# Patient Record
Sex: Male | Born: 1950
Health system: Southern US, Community
[De-identification: ages and names within clinical notes are randomized; demographics above are authoritative.]

---

## 2005-11-18 ENCOUNTER — Encounter: Admission: RE | Admit: 2005-11-18 | Discharge: 2005-11-18 | Payer: Self-pay | Admitting: Internal Medicine

## 2007-03-20 ENCOUNTER — Encounter: Admission: RE | Admit: 2007-03-20 | Discharge: 2007-03-20 | Payer: Self-pay | Admitting: Gastroenterology

## 2007-11-25 ENCOUNTER — Encounter: Admission: RE | Admit: 2007-11-25 | Discharge: 2007-11-25 | Payer: Self-pay | Admitting: Endocrinology

## 2013-03-09 DIAGNOSIS — H547 Unspecified visual loss: Secondary | ICD-10-CM | POA: Diagnosis not present

## 2013-03-09 DIAGNOSIS — R040 Epistaxis: Secondary | ICD-10-CM | POA: Diagnosis not present

## 2013-03-09 DIAGNOSIS — J3489 Other specified disorders of nose and nasal sinuses: Secondary | ICD-10-CM | POA: Diagnosis not present

## 2013-03-09 DIAGNOSIS — J329 Chronic sinusitis, unspecified: Secondary | ICD-10-CM | POA: Diagnosis not present

## 2013-03-16 DIAGNOSIS — H538 Other visual disturbances: Secondary | ICD-10-CM | POA: Diagnosis not present

## 2013-03-16 DIAGNOSIS — H35349 Macular cyst, hole, or pseudohole, unspecified eye: Secondary | ICD-10-CM | POA: Diagnosis not present

## 2013-03-18 DIAGNOSIS — H5210 Myopia, unspecified eye: Secondary | ICD-10-CM | POA: Diagnosis not present

## 2013-03-18 DIAGNOSIS — Z9889 Other specified postprocedural states: Secondary | ICD-10-CM | POA: Diagnosis not present

## 2013-03-18 DIAGNOSIS — H43829 Vitreomacular adhesion, unspecified eye: Secondary | ICD-10-CM | POA: Diagnosis not present

## 2013-03-18 DIAGNOSIS — H35349 Macular cyst, hole, or pseudohole, unspecified eye: Secondary | ICD-10-CM | POA: Diagnosis not present

## 2013-04-08 ENCOUNTER — Ambulatory Visit
Admission: RE | Admit: 2013-04-08 | Discharge: 2013-04-08 | Disposition: A | Discharge status: Home / Self Care | Payer: 59 | Source: Ambulatory Visit | Attending: Internal Medicine | Admitting: Internal Medicine

## 2013-04-08 ENCOUNTER — Other Ambulatory Visit: Payer: Self-pay | Admitting: Internal Medicine

## 2013-04-08 DIAGNOSIS — E785 Hyperlipidemia, unspecified: Secondary | ICD-10-CM | POA: Diagnosis not present

## 2013-04-08 DIAGNOSIS — Z79899 Other long term (current) drug therapy: Secondary | ICD-10-CM | POA: Diagnosis not present

## 2013-04-08 DIAGNOSIS — M545 Low back pain, unspecified: Secondary | ICD-10-CM

## 2013-04-08 DIAGNOSIS — N400 Enlarged prostate without lower urinary tract symptoms: Secondary | ICD-10-CM | POA: Diagnosis not present

## 2013-04-08 DIAGNOSIS — M47817 Spondylosis without myelopathy or radiculopathy, lumbosacral region: Secondary | ICD-10-CM | POA: Diagnosis not present

## 2013-04-08 DIAGNOSIS — Z0000 Encounter for general adult medical examination without abnormal findings: Secondary | ICD-10-CM | POA: Diagnosis not present

## 2013-04-08 DIAGNOSIS — E291 Testicular hypofunction: Secondary | ICD-10-CM | POA: Diagnosis not present

## 2013-04-08 DIAGNOSIS — Z23 Encounter for immunization: Secondary | ICD-10-CM | POA: Diagnosis not present

## 2013-04-08 DIAGNOSIS — I10 Essential (primary) hypertension: Secondary | ICD-10-CM | POA: Diagnosis not present

## 2013-04-08 DIAGNOSIS — Z1331 Encounter for screening for depression: Secondary | ICD-10-CM | POA: Diagnosis not present

## 2013-04-21 DIAGNOSIS — H3581 Retinal edema: Secondary | ICD-10-CM | POA: Diagnosis not present

## 2013-04-21 DIAGNOSIS — Z7982 Long term (current) use of aspirin: Secondary | ICD-10-CM | POA: Diagnosis not present

## 2013-04-21 DIAGNOSIS — I10 Essential (primary) hypertension: Secondary | ICD-10-CM | POA: Diagnosis not present

## 2013-04-21 DIAGNOSIS — H35349 Macular cyst, hole, or pseudohole, unspecified eye: Secondary | ICD-10-CM | POA: Diagnosis not present

## 2013-04-21 DIAGNOSIS — K219 Gastro-esophageal reflux disease without esophagitis: Secondary | ICD-10-CM | POA: Diagnosis not present

## 2013-05-24 DIAGNOSIS — H35349 Macular cyst, hole, or pseudohole, unspecified eye: Secondary | ICD-10-CM | POA: Diagnosis not present

## 2013-05-28 DIAGNOSIS — K219 Gastro-esophageal reflux disease without esophagitis: Secondary | ICD-10-CM | POA: Diagnosis not present

## 2013-05-28 DIAGNOSIS — H35349 Macular cyst, hole, or pseudohole, unspecified eye: Secondary | ICD-10-CM | POA: Diagnosis not present

## 2013-05-28 DIAGNOSIS — I10 Essential (primary) hypertension: Secondary | ICD-10-CM | POA: Diagnosis not present

## 2013-05-28 DIAGNOSIS — Z7982 Long term (current) use of aspirin: Secondary | ICD-10-CM | POA: Diagnosis not present

## 2013-06-01 DIAGNOSIS — M545 Low back pain, unspecified: Secondary | ICD-10-CM | POA: Diagnosis not present

## 2013-06-22 DIAGNOSIS — H43829 Vitreomacular adhesion, unspecified eye: Secondary | ICD-10-CM | POA: Diagnosis not present

## 2013-06-22 DIAGNOSIS — Z48810 Encounter for surgical aftercare following surgery on the sense organs: Secondary | ICD-10-CM | POA: Diagnosis not present

## 2013-06-22 DIAGNOSIS — Z9889 Other specified postprocedural states: Secondary | ICD-10-CM | POA: Diagnosis not present

## 2013-07-08 DIAGNOSIS — R5381 Other malaise: Secondary | ICD-10-CM | POA: Diagnosis not present

## 2013-07-08 DIAGNOSIS — E559 Vitamin D deficiency, unspecified: Secondary | ICD-10-CM | POA: Diagnosis not present

## 2013-07-08 DIAGNOSIS — M810 Age-related osteoporosis without current pathological fracture: Secondary | ICD-10-CM | POA: Diagnosis not present

## 2013-07-08 DIAGNOSIS — E291 Testicular hypofunction: Secondary | ICD-10-CM | POA: Diagnosis not present

## 2013-07-08 DIAGNOSIS — IMO0001 Reserved for inherently not codable concepts without codable children: Secondary | ICD-10-CM | POA: Diagnosis not present

## 2013-07-27 DIAGNOSIS — R040 Epistaxis: Secondary | ICD-10-CM | POA: Diagnosis not present

## 2013-07-27 DIAGNOSIS — J342 Deviated nasal septum: Secondary | ICD-10-CM | POA: Diagnosis not present

## 2013-07-27 DIAGNOSIS — J328 Other chronic sinusitis: Secondary | ICD-10-CM | POA: Diagnosis not present

## 2013-08-10 DIAGNOSIS — IMO0001 Reserved for inherently not codable concepts without codable children: Secondary | ICD-10-CM | POA: Diagnosis not present

## 2013-08-19 DIAGNOSIS — H43829 Vitreomacular adhesion, unspecified eye: Secondary | ICD-10-CM | POA: Diagnosis not present

## 2013-08-19 DIAGNOSIS — Z9889 Other specified postprocedural states: Secondary | ICD-10-CM | POA: Diagnosis not present

## 2013-08-19 DIAGNOSIS — Z48810 Encounter for surgical aftercare following surgery on the sense organs: Secondary | ICD-10-CM | POA: Diagnosis not present

## 2013-09-23 DIAGNOSIS — R5383 Other fatigue: Secondary | ICD-10-CM | POA: Diagnosis not present

## 2013-09-23 DIAGNOSIS — IMO0001 Reserved for inherently not codable concepts without codable children: Secondary | ICD-10-CM | POA: Diagnosis not present

## 2013-09-23 DIAGNOSIS — R5381 Other malaise: Secondary | ICD-10-CM | POA: Diagnosis not present

## 2013-09-23 DIAGNOSIS — R748 Abnormal levels of other serum enzymes: Secondary | ICD-10-CM | POA: Diagnosis not present

## 2013-10-12 DIAGNOSIS — D485 Neoplasm of uncertain behavior of skin: Secondary | ICD-10-CM | POA: Diagnosis not present

## 2013-10-12 DIAGNOSIS — L820 Inflamed seborrheic keratosis: Secondary | ICD-10-CM | POA: Diagnosis not present

## 2013-10-12 DIAGNOSIS — L578 Other skin changes due to chronic exposure to nonionizing radiation: Secondary | ICD-10-CM | POA: Diagnosis not present

## 2013-10-12 DIAGNOSIS — Z872 Personal history of diseases of the skin and subcutaneous tissue: Secondary | ICD-10-CM | POA: Diagnosis not present

## 2013-10-12 DIAGNOSIS — D239 Other benign neoplasm of skin, unspecified: Secondary | ICD-10-CM | POA: Diagnosis not present

## 2013-10-21 DIAGNOSIS — K219 Gastro-esophageal reflux disease without esophagitis: Secondary | ICD-10-CM | POA: Diagnosis not present

## 2013-10-21 DIAGNOSIS — Z23 Encounter for immunization: Secondary | ICD-10-CM | POA: Diagnosis not present

## 2013-10-21 DIAGNOSIS — I10 Essential (primary) hypertension: Secondary | ICD-10-CM | POA: Diagnosis not present

## 2013-10-21 DIAGNOSIS — IMO0001 Reserved for inherently not codable concepts without codable children: Secondary | ICD-10-CM | POA: Diagnosis not present

## 2013-10-21 DIAGNOSIS — C4330 Malignant melanoma of unspecified part of face: Secondary | ICD-10-CM | POA: Diagnosis not present

## 2013-11-18 DIAGNOSIS — D039 Melanoma in situ, unspecified: Secondary | ICD-10-CM | POA: Diagnosis not present

## 2013-11-18 DIAGNOSIS — D0339 Melanoma in situ of other parts of face: Secondary | ICD-10-CM | POA: Diagnosis not present

## 2013-11-18 DIAGNOSIS — C44519 Basal cell carcinoma of skin of other part of trunk: Secondary | ICD-10-CM | POA: Diagnosis not present

## 2013-12-14 DIAGNOSIS — Z9889 Other specified postprocedural states: Secondary | ICD-10-CM | POA: Diagnosis not present

## 2013-12-14 DIAGNOSIS — H47233 Glaucomatous optic atrophy, bilateral: Secondary | ICD-10-CM | POA: Diagnosis not present

## 2013-12-14 DIAGNOSIS — H25811 Combined forms of age-related cataract, right eye: Secondary | ICD-10-CM | POA: Diagnosis not present

## 2013-12-14 DIAGNOSIS — H2512 Age-related nuclear cataract, left eye: Secondary | ICD-10-CM | POA: Diagnosis not present

## 2013-12-20 DIAGNOSIS — Z9889 Other specified postprocedural states: Secondary | ICD-10-CM | POA: Diagnosis not present

## 2013-12-20 DIAGNOSIS — N400 Enlarged prostate without lower urinary tract symptoms: Secondary | ICD-10-CM | POA: Diagnosis not present

## 2013-12-20 DIAGNOSIS — K219 Gastro-esophageal reflux disease without esophagitis: Secondary | ICD-10-CM | POA: Diagnosis not present

## 2013-12-20 DIAGNOSIS — H25811 Combined forms of age-related cataract, right eye: Secondary | ICD-10-CM | POA: Diagnosis not present

## 2013-12-20 DIAGNOSIS — I10 Essential (primary) hypertension: Secondary | ICD-10-CM | POA: Diagnosis not present

## 2014-01-05 DIAGNOSIS — I10 Essential (primary) hypertension: Secondary | ICD-10-CM | POA: Diagnosis not present

## 2014-01-05 DIAGNOSIS — H269 Unspecified cataract: Secondary | ICD-10-CM | POA: Diagnosis not present

## 2014-01-05 DIAGNOSIS — H25811 Combined forms of age-related cataract, right eye: Secondary | ICD-10-CM | POA: Diagnosis not present

## 2014-01-05 DIAGNOSIS — K219 Gastro-esophageal reflux disease without esophagitis: Secondary | ICD-10-CM | POA: Diagnosis not present

## 2014-01-05 DIAGNOSIS — N400 Enlarged prostate without lower urinary tract symptoms: Secondary | ICD-10-CM | POA: Diagnosis not present

## 2014-01-25 DIAGNOSIS — J3489 Other specified disorders of nose and nasal sinuses: Secondary | ICD-10-CM | POA: Diagnosis not present

## 2014-01-25 DIAGNOSIS — J328 Other chronic sinusitis: Secondary | ICD-10-CM | POA: Diagnosis not present

## 2014-03-24 DIAGNOSIS — M810 Age-related osteoporosis without current pathological fracture: Secondary | ICD-10-CM | POA: Diagnosis not present

## 2014-03-24 DIAGNOSIS — M791 Myalgia: Secondary | ICD-10-CM | POA: Diagnosis not present

## 2014-03-24 DIAGNOSIS — M549 Dorsalgia, unspecified: Secondary | ICD-10-CM | POA: Diagnosis not present

## 2014-03-24 DIAGNOSIS — M6080 Other myositis, unspecified site: Secondary | ICD-10-CM | POA: Diagnosis not present

## 2014-03-24 DIAGNOSIS — M609 Myositis, unspecified: Secondary | ICD-10-CM | POA: Diagnosis not present

## 2014-04-05 DIAGNOSIS — R0789 Other chest pain: Secondary | ICD-10-CM | POA: Diagnosis not present

## 2014-04-05 DIAGNOSIS — R0600 Dyspnea, unspecified: Secondary | ICD-10-CM | POA: Diagnosis not present

## 2014-04-12 DIAGNOSIS — R079 Chest pain, unspecified: Secondary | ICD-10-CM | POA: Diagnosis not present

## 2014-04-21 DIAGNOSIS — N400 Enlarged prostate without lower urinary tract symptoms: Secondary | ICD-10-CM | POA: Diagnosis not present

## 2014-04-21 DIAGNOSIS — R5383 Other fatigue: Secondary | ICD-10-CM | POA: Diagnosis not present

## 2014-04-21 DIAGNOSIS — E782 Mixed hyperlipidemia: Secondary | ICD-10-CM | POA: Diagnosis not present

## 2014-04-21 DIAGNOSIS — Z1389 Encounter for screening for other disorder: Secondary | ICD-10-CM | POA: Diagnosis not present

## 2014-04-21 DIAGNOSIS — Z23 Encounter for immunization: Secondary | ICD-10-CM | POA: Diagnosis not present

## 2014-04-21 DIAGNOSIS — I10 Essential (primary) hypertension: Secondary | ICD-10-CM | POA: Diagnosis not present

## 2014-04-21 DIAGNOSIS — M545 Low back pain: Secondary | ICD-10-CM | POA: Diagnosis not present

## 2014-04-21 DIAGNOSIS — K219 Gastro-esophageal reflux disease without esophagitis: Secondary | ICD-10-CM | POA: Diagnosis not present

## 2014-04-22 ENCOUNTER — Other Ambulatory Visit: Payer: Self-pay | Admitting: Internal Medicine

## 2014-04-22 DIAGNOSIS — M79604 Pain in right leg: Secondary | ICD-10-CM

## 2014-04-22 DIAGNOSIS — M545 Low back pain: Secondary | ICD-10-CM

## 2014-05-04 ENCOUNTER — Ambulatory Visit
Admission: RE | Admit: 2014-05-04 | Discharge: 2014-05-04 | Disposition: A | Discharge status: Home / Self Care | Payer: Medicare Other | Source: Ambulatory Visit | Attending: Internal Medicine | Admitting: Internal Medicine

## 2014-05-04 DIAGNOSIS — M545 Low back pain: Secondary | ICD-10-CM

## 2014-05-04 DIAGNOSIS — M79604 Pain in right leg: Secondary | ICD-10-CM

## 2014-05-04 DIAGNOSIS — M4806 Spinal stenosis, lumbar region: Secondary | ICD-10-CM | POA: Diagnosis not present

## 2014-05-04 DIAGNOSIS — M5127 Other intervertebral disc displacement, lumbosacral region: Secondary | ICD-10-CM | POA: Diagnosis not present

## 2014-05-04 DIAGNOSIS — M47817 Spondylosis without myelopathy or radiculopathy, lumbosacral region: Secondary | ICD-10-CM | POA: Diagnosis not present

## 2014-05-17 DIAGNOSIS — H25811 Combined forms of age-related cataract, right eye: Secondary | ICD-10-CM | POA: Diagnosis not present

## 2014-05-17 DIAGNOSIS — H43822 Vitreomacular adhesion, left eye: Secondary | ICD-10-CM | POA: Diagnosis not present

## 2014-05-17 DIAGNOSIS — Z9841 Cataract extraction status, right eye: Secondary | ICD-10-CM | POA: Diagnosis not present

## 2014-05-17 DIAGNOSIS — Z9889 Other specified postprocedural states: Secondary | ICD-10-CM | POA: Diagnosis not present

## 2014-05-17 DIAGNOSIS — Z961 Presence of intraocular lens: Secondary | ICD-10-CM | POA: Diagnosis not present

## 2014-05-17 DIAGNOSIS — H35341 Macular cyst, hole, or pseudohole, right eye: Secondary | ICD-10-CM | POA: Diagnosis not present

## 2014-05-17 DIAGNOSIS — H40013 Open angle with borderline findings, low risk, bilateral: Secondary | ICD-10-CM | POA: Diagnosis not present

## 2014-07-11 DIAGNOSIS — M545 Low back pain: Secondary | ICD-10-CM | POA: Diagnosis not present

## 2014-07-11 DIAGNOSIS — M5416 Radiculopathy, lumbar region: Secondary | ICD-10-CM | POA: Diagnosis not present

## 2014-07-11 DIAGNOSIS — M4316 Spondylolisthesis, lumbar region: Secondary | ICD-10-CM | POA: Diagnosis not present

## 2014-07-11 DIAGNOSIS — M4806 Spinal stenosis, lumbar region: Secondary | ICD-10-CM | POA: Diagnosis not present

## 2014-08-30 DIAGNOSIS — H40013 Open angle with borderline findings, low risk, bilateral: Secondary | ICD-10-CM | POA: Diagnosis not present

## 2014-08-30 DIAGNOSIS — H40012 Open angle with borderline findings, low risk, left eye: Secondary | ICD-10-CM | POA: Diagnosis not present

## 2014-08-30 DIAGNOSIS — H2512 Age-related nuclear cataract, left eye: Secondary | ICD-10-CM | POA: Diagnosis not present

## 2014-08-30 DIAGNOSIS — H25811 Combined forms of age-related cataract, right eye: Secondary | ICD-10-CM | POA: Diagnosis not present

## 2014-08-30 DIAGNOSIS — H4011X1 Primary open-angle glaucoma, mild stage: Secondary | ICD-10-CM | POA: Diagnosis not present

## 2014-08-30 DIAGNOSIS — H35341 Macular cyst, hole, or pseudohole, right eye: Secondary | ICD-10-CM | POA: Diagnosis not present

## 2014-09-12 DIAGNOSIS — M5416 Radiculopathy, lumbar region: Secondary | ICD-10-CM | POA: Diagnosis not present

## 2014-09-12 DIAGNOSIS — M545 Low back pain: Secondary | ICD-10-CM | POA: Diagnosis not present

## 2014-09-12 DIAGNOSIS — M4316 Spondylolisthesis, lumbar region: Secondary | ICD-10-CM | POA: Diagnosis not present

## 2014-09-12 DIAGNOSIS — M4806 Spinal stenosis, lumbar region: Secondary | ICD-10-CM | POA: Diagnosis not present

## 2014-10-17 DIAGNOSIS — M545 Low back pain: Secondary | ICD-10-CM | POA: Diagnosis not present

## 2014-10-17 DIAGNOSIS — M47816 Spondylosis without myelopathy or radiculopathy, lumbar region: Secondary | ICD-10-CM | POA: Diagnosis not present

## 2014-10-17 DIAGNOSIS — M4806 Spinal stenosis, lumbar region: Secondary | ICD-10-CM | POA: Diagnosis not present

## 2014-10-18 DIAGNOSIS — H40012 Open angle with borderline findings, low risk, left eye: Secondary | ICD-10-CM | POA: Diagnosis not present

## 2014-10-18 DIAGNOSIS — H4011X1 Primary open-angle glaucoma, mild stage: Secondary | ICD-10-CM | POA: Diagnosis not present

## 2014-10-21 DIAGNOSIS — L578 Other skin changes due to chronic exposure to nonionizing radiation: Secondary | ICD-10-CM | POA: Diagnosis not present

## 2014-10-21 DIAGNOSIS — Z87898 Personal history of other specified conditions: Secondary | ICD-10-CM | POA: Diagnosis not present

## 2014-10-21 DIAGNOSIS — H2512 Age-related nuclear cataract, left eye: Secondary | ICD-10-CM | POA: Diagnosis not present

## 2014-10-21 DIAGNOSIS — Z961 Presence of intraocular lens: Secondary | ICD-10-CM | POA: Diagnosis not present

## 2014-10-21 DIAGNOSIS — D1801 Hemangioma of skin and subcutaneous tissue: Secondary | ICD-10-CM | POA: Diagnosis not present

## 2014-10-21 DIAGNOSIS — H43822 Vitreomacular adhesion, left eye: Secondary | ICD-10-CM | POA: Diagnosis not present

## 2014-10-21 DIAGNOSIS — L570 Actinic keratosis: Secondary | ICD-10-CM | POA: Diagnosis not present

## 2014-10-21 DIAGNOSIS — L821 Other seborrheic keratosis: Secondary | ICD-10-CM | POA: Diagnosis not present

## 2014-10-21 DIAGNOSIS — Z85828 Personal history of other malignant neoplasm of skin: Secondary | ICD-10-CM | POA: Diagnosis not present

## 2014-10-21 DIAGNOSIS — H35341 Macular cyst, hole, or pseudohole, right eye: Secondary | ICD-10-CM | POA: Diagnosis not present

## 2014-10-24 DIAGNOSIS — M4806 Spinal stenosis, lumbar region: Secondary | ICD-10-CM | POA: Diagnosis not present

## 2014-10-24 DIAGNOSIS — I10 Essential (primary) hypertension: Secondary | ICD-10-CM | POA: Diagnosis not present

## 2014-10-24 DIAGNOSIS — Z23 Encounter for immunization: Secondary | ICD-10-CM | POA: Diagnosis not present

## 2014-10-24 DIAGNOSIS — K219 Gastro-esophageal reflux disease without esophagitis: Secondary | ICD-10-CM | POA: Diagnosis not present

## 2014-11-14 DIAGNOSIS — M545 Low back pain: Secondary | ICD-10-CM | POA: Diagnosis not present

## 2014-11-21 DIAGNOSIS — H2512 Age-related nuclear cataract, left eye: Secondary | ICD-10-CM | POA: Diagnosis not present

## 2014-11-21 DIAGNOSIS — H35341 Macular cyst, hole, or pseudohole, right eye: Secondary | ICD-10-CM | POA: Diagnosis not present

## 2014-11-21 DIAGNOSIS — Z961 Presence of intraocular lens: Secondary | ICD-10-CM | POA: Diagnosis not present

## 2014-11-21 DIAGNOSIS — E785 Hyperlipidemia, unspecified: Secondary | ICD-10-CM | POA: Diagnosis not present

## 2014-11-21 DIAGNOSIS — Z7982 Long term (current) use of aspirin: Secondary | ICD-10-CM | POA: Diagnosis not present

## 2014-11-21 DIAGNOSIS — H269 Unspecified cataract: Secondary | ICD-10-CM | POA: Diagnosis not present

## 2014-11-21 DIAGNOSIS — I10 Essential (primary) hypertension: Secondary | ICD-10-CM | POA: Diagnosis not present

## 2014-11-21 DIAGNOSIS — H43822 Vitreomacular adhesion, left eye: Secondary | ICD-10-CM | POA: Diagnosis not present

## 2014-11-21 DIAGNOSIS — H5213 Myopia, bilateral: Secondary | ICD-10-CM | POA: Diagnosis not present

## 2014-11-21 DIAGNOSIS — K219 Gastro-esophageal reflux disease without esophagitis: Secondary | ICD-10-CM | POA: Diagnosis not present

## 2014-11-21 DIAGNOSIS — Z79899 Other long term (current) drug therapy: Secondary | ICD-10-CM | POA: Diagnosis not present

## 2014-12-27 DIAGNOSIS — M5416 Radiculopathy, lumbar region: Secondary | ICD-10-CM | POA: Diagnosis not present

## 2014-12-27 DIAGNOSIS — M4316 Spondylolisthesis, lumbar region: Secondary | ICD-10-CM | POA: Diagnosis not present

## 2014-12-27 DIAGNOSIS — M4806 Spinal stenosis, lumbar region: Secondary | ICD-10-CM | POA: Diagnosis not present

## 2014-12-27 DIAGNOSIS — Z6829 Body mass index (BMI) 29.0-29.9, adult: Secondary | ICD-10-CM | POA: Diagnosis not present

## 2014-12-27 DIAGNOSIS — M47816 Spondylosis without myelopathy or radiculopathy, lumbar region: Secondary | ICD-10-CM | POA: Diagnosis not present

## 2014-12-27 DIAGNOSIS — M545 Low back pain: Secondary | ICD-10-CM | POA: Diagnosis not present

## 2015-01-12 DIAGNOSIS — M47816 Spondylosis without myelopathy or radiculopathy, lumbar region: Secondary | ICD-10-CM | POA: Diagnosis not present

## 2015-01-12 DIAGNOSIS — M4316 Spondylolisthesis, lumbar region: Secondary | ICD-10-CM | POA: Diagnosis not present

## 2015-01-23 DIAGNOSIS — H35372 Puckering of macula, left eye: Secondary | ICD-10-CM | POA: Diagnosis not present

## 2015-01-23 DIAGNOSIS — H43822 Vitreomacular adhesion, left eye: Secondary | ICD-10-CM | POA: Diagnosis not present

## 2015-01-23 DIAGNOSIS — I10 Essential (primary) hypertension: Secondary | ICD-10-CM | POA: Diagnosis not present

## 2015-01-23 DIAGNOSIS — Z7982 Long term (current) use of aspirin: Secondary | ICD-10-CM | POA: Diagnosis not present

## 2015-01-23 DIAGNOSIS — H35341 Macular cyst, hole, or pseudohole, right eye: Secondary | ICD-10-CM | POA: Diagnosis not present

## 2015-01-23 DIAGNOSIS — Z9841 Cataract extraction status, right eye: Secondary | ICD-10-CM | POA: Diagnosis not present

## 2015-01-23 DIAGNOSIS — Z79899 Other long term (current) drug therapy: Secondary | ICD-10-CM | POA: Diagnosis not present

## 2015-01-23 DIAGNOSIS — H5213 Myopia, bilateral: Secondary | ICD-10-CM | POA: Diagnosis not present

## 2015-01-23 DIAGNOSIS — Z9889 Other specified postprocedural states: Secondary | ICD-10-CM | POA: Diagnosis not present

## 2015-01-23 DIAGNOSIS — Z961 Presence of intraocular lens: Secondary | ICD-10-CM | POA: Diagnosis not present

## 2015-01-23 DIAGNOSIS — E785 Hyperlipidemia, unspecified: Secondary | ICD-10-CM | POA: Diagnosis not present

## 2015-01-26 DIAGNOSIS — M47816 Spondylosis without myelopathy or radiculopathy, lumbar region: Secondary | ICD-10-CM | POA: Diagnosis not present

## 2015-01-26 DIAGNOSIS — M5416 Radiculopathy, lumbar region: Secondary | ICD-10-CM | POA: Diagnosis not present

## 2015-01-26 DIAGNOSIS — M4806 Spinal stenosis, lumbar region: Secondary | ICD-10-CM | POA: Diagnosis not present

## 2015-01-26 DIAGNOSIS — M4316 Spondylolisthesis, lumbar region: Secondary | ICD-10-CM | POA: Diagnosis not present

## 2015-02-08 DIAGNOSIS — H35372 Puckering of macula, left eye: Secondary | ICD-10-CM | POA: Diagnosis not present

## 2015-02-08 DIAGNOSIS — Z91030 Bee allergy status: Secondary | ICD-10-CM | POA: Diagnosis not present

## 2015-02-08 DIAGNOSIS — H43822 Vitreomacular adhesion, left eye: Secondary | ICD-10-CM | POA: Diagnosis not present

## 2015-02-08 DIAGNOSIS — N400 Enlarged prostate without lower urinary tract symptoms: Secondary | ICD-10-CM | POA: Diagnosis not present

## 2015-02-08 DIAGNOSIS — Z7951 Long term (current) use of inhaled steroids: Secondary | ICD-10-CM | POA: Diagnosis not present

## 2015-02-08 DIAGNOSIS — H35342 Macular cyst, hole, or pseudohole, left eye: Secondary | ICD-10-CM | POA: Diagnosis not present

## 2015-02-08 DIAGNOSIS — Z961 Presence of intraocular lens: Secondary | ICD-10-CM | POA: Diagnosis not present

## 2015-02-08 DIAGNOSIS — M810 Age-related osteoporosis without current pathological fracture: Secondary | ICD-10-CM | POA: Diagnosis not present

## 2015-02-08 DIAGNOSIS — Z85828 Personal history of other malignant neoplasm of skin: Secondary | ICD-10-CM | POA: Diagnosis not present

## 2015-02-08 DIAGNOSIS — Z79899 Other long term (current) drug therapy: Secondary | ICD-10-CM | POA: Diagnosis not present

## 2015-02-08 DIAGNOSIS — I10 Essential (primary) hypertension: Secondary | ICD-10-CM | POA: Diagnosis not present

## 2015-02-08 DIAGNOSIS — E785 Hyperlipidemia, unspecified: Secondary | ICD-10-CM | POA: Diagnosis not present

## 2015-02-08 DIAGNOSIS — K219 Gastro-esophageal reflux disease without esophagitis: Secondary | ICD-10-CM | POA: Diagnosis not present

## 2015-02-08 DIAGNOSIS — Z85820 Personal history of malignant melanoma of skin: Secondary | ICD-10-CM | POA: Diagnosis not present

## 2015-02-08 DIAGNOSIS — Z7982 Long term (current) use of aspirin: Secondary | ICD-10-CM | POA: Diagnosis not present

## 2015-02-09 DIAGNOSIS — H43822 Vitreomacular adhesion, left eye: Secondary | ICD-10-CM | POA: Diagnosis not present

## 2015-02-13 DIAGNOSIS — Z7982 Long term (current) use of aspirin: Secondary | ICD-10-CM | POA: Diagnosis not present

## 2015-02-13 DIAGNOSIS — H5213 Myopia, bilateral: Secondary | ICD-10-CM | POA: Diagnosis not present

## 2015-02-13 DIAGNOSIS — H35372 Puckering of macula, left eye: Secondary | ICD-10-CM | POA: Diagnosis not present

## 2015-02-13 DIAGNOSIS — Z9841 Cataract extraction status, right eye: Secondary | ICD-10-CM | POA: Diagnosis not present

## 2015-02-13 DIAGNOSIS — Z961 Presence of intraocular lens: Secondary | ICD-10-CM | POA: Diagnosis not present

## 2015-02-13 DIAGNOSIS — Z79899 Other long term (current) drug therapy: Secondary | ICD-10-CM | POA: Diagnosis not present

## 2015-02-13 DIAGNOSIS — H2512 Age-related nuclear cataract, left eye: Secondary | ICD-10-CM | POA: Diagnosis not present

## 2015-02-13 DIAGNOSIS — H43822 Vitreomacular adhesion, left eye: Secondary | ICD-10-CM | POA: Diagnosis not present

## 2015-02-28 DIAGNOSIS — Z9889 Other specified postprocedural states: Secondary | ICD-10-CM | POA: Diagnosis not present

## 2015-02-28 DIAGNOSIS — H35341 Macular cyst, hole, or pseudohole, right eye: Secondary | ICD-10-CM | POA: Diagnosis not present

## 2015-02-28 DIAGNOSIS — H401111 Primary open-angle glaucoma, right eye, mild stage: Secondary | ICD-10-CM | POA: Diagnosis not present

## 2015-02-28 DIAGNOSIS — H25811 Combined forms of age-related cataract, right eye: Secondary | ICD-10-CM | POA: Diagnosis not present

## 2015-02-28 DIAGNOSIS — H40012 Open angle with borderline findings, low risk, left eye: Secondary | ICD-10-CM | POA: Diagnosis not present

## 2015-03-06 DIAGNOSIS — Z9841 Cataract extraction status, right eye: Secondary | ICD-10-CM | POA: Diagnosis not present

## 2015-03-06 DIAGNOSIS — Z7982 Long term (current) use of aspirin: Secondary | ICD-10-CM | POA: Diagnosis not present

## 2015-03-06 DIAGNOSIS — Z79899 Other long term (current) drug therapy: Secondary | ICD-10-CM | POA: Diagnosis not present

## 2015-03-06 DIAGNOSIS — H35342 Macular cyst, hole, or pseudohole, left eye: Secondary | ICD-10-CM | POA: Diagnosis not present

## 2015-03-06 DIAGNOSIS — H2512 Age-related nuclear cataract, left eye: Secondary | ICD-10-CM | POA: Diagnosis not present

## 2015-03-06 DIAGNOSIS — I10 Essential (primary) hypertension: Secondary | ICD-10-CM | POA: Diagnosis not present

## 2015-03-06 DIAGNOSIS — E785 Hyperlipidemia, unspecified: Secondary | ICD-10-CM | POA: Diagnosis not present

## 2015-03-06 DIAGNOSIS — H43822 Vitreomacular adhesion, left eye: Secondary | ICD-10-CM | POA: Diagnosis not present

## 2015-03-06 DIAGNOSIS — Z961 Presence of intraocular lens: Secondary | ICD-10-CM | POA: Diagnosis not present

## 2015-03-23 DIAGNOSIS — J3489 Other specified disorders of nose and nasal sinuses: Secondary | ICD-10-CM | POA: Diagnosis not present

## 2015-03-23 DIAGNOSIS — J328 Other chronic sinusitis: Secondary | ICD-10-CM | POA: Diagnosis not present

## 2015-03-30 DIAGNOSIS — M47816 Spondylosis without myelopathy or radiculopathy, lumbar region: Secondary | ICD-10-CM | POA: Diagnosis not present

## 2015-04-20 DIAGNOSIS — H269 Unspecified cataract: Secondary | ICD-10-CM | POA: Diagnosis not present

## 2015-04-20 DIAGNOSIS — H25811 Combined forms of age-related cataract, right eye: Secondary | ICD-10-CM | POA: Diagnosis not present

## 2015-04-20 DIAGNOSIS — H2512 Age-related nuclear cataract, left eye: Secondary | ICD-10-CM | POA: Diagnosis not present

## 2015-04-20 DIAGNOSIS — Z961 Presence of intraocular lens: Secondary | ICD-10-CM | POA: Diagnosis not present

## 2015-04-20 DIAGNOSIS — H40012 Open angle with borderline findings, low risk, left eye: Secondary | ICD-10-CM | POA: Diagnosis not present

## 2015-04-20 DIAGNOSIS — I10 Essential (primary) hypertension: Secondary | ICD-10-CM | POA: Diagnosis not present

## 2015-04-20 DIAGNOSIS — H35342 Macular cyst, hole, or pseudohole, left eye: Secondary | ICD-10-CM | POA: Diagnosis not present

## 2015-04-20 DIAGNOSIS — E785 Hyperlipidemia, unspecified: Secondary | ICD-10-CM | POA: Diagnosis not present

## 2015-04-20 DIAGNOSIS — H401111 Primary open-angle glaucoma, right eye, mild stage: Secondary | ICD-10-CM | POA: Diagnosis not present

## 2015-04-20 DIAGNOSIS — Z7982 Long term (current) use of aspirin: Secondary | ICD-10-CM | POA: Diagnosis not present

## 2015-04-20 DIAGNOSIS — H5213 Myopia, bilateral: Secondary | ICD-10-CM | POA: Diagnosis not present

## 2015-04-20 DIAGNOSIS — Z79899 Other long term (current) drug therapy: Secondary | ICD-10-CM | POA: Diagnosis not present

## 2015-04-20 DIAGNOSIS — H35341 Macular cyst, hole, or pseudohole, right eye: Secondary | ICD-10-CM | POA: Diagnosis not present

## 2015-04-20 DIAGNOSIS — Z9841 Cataract extraction status, right eye: Secondary | ICD-10-CM | POA: Diagnosis not present

## 2015-04-20 DIAGNOSIS — Z9889 Other specified postprocedural states: Secondary | ICD-10-CM | POA: Diagnosis not present

## 2015-05-02 DIAGNOSIS — Z9841 Cataract extraction status, right eye: Secondary | ICD-10-CM | POA: Diagnosis not present

## 2015-05-02 DIAGNOSIS — Z7982 Long term (current) use of aspirin: Secondary | ICD-10-CM | POA: Diagnosis not present

## 2015-05-02 DIAGNOSIS — Z961 Presence of intraocular lens: Secondary | ICD-10-CM | POA: Diagnosis not present

## 2015-05-02 DIAGNOSIS — Z79899 Other long term (current) drug therapy: Secondary | ICD-10-CM | POA: Diagnosis not present

## 2015-05-02 DIAGNOSIS — Z9889 Other specified postprocedural states: Secondary | ICD-10-CM | POA: Diagnosis not present

## 2015-05-02 DIAGNOSIS — I10 Essential (primary) hypertension: Secondary | ICD-10-CM | POA: Diagnosis not present

## 2015-05-02 DIAGNOSIS — E785 Hyperlipidemia, unspecified: Secondary | ICD-10-CM | POA: Diagnosis not present

## 2015-05-02 DIAGNOSIS — H2512 Age-related nuclear cataract, left eye: Secondary | ICD-10-CM | POA: Diagnosis not present

## 2015-05-02 DIAGNOSIS — H401110 Primary open-angle glaucoma, right eye, stage unspecified: Secondary | ICD-10-CM | POA: Diagnosis not present

## 2015-05-04 DIAGNOSIS — I10 Essential (primary) hypertension: Secondary | ICD-10-CM | POA: Diagnosis not present

## 2015-05-04 DIAGNOSIS — M62838 Other muscle spasm: Secondary | ICD-10-CM | POA: Diagnosis not present

## 2015-05-04 DIAGNOSIS — M810 Age-related osteoporosis without current pathological fracture: Secondary | ICD-10-CM | POA: Diagnosis not present

## 2015-05-04 DIAGNOSIS — Z6829 Body mass index (BMI) 29.0-29.9, adult: Secondary | ICD-10-CM | POA: Diagnosis not present

## 2015-05-04 DIAGNOSIS — M47816 Spondylosis without myelopathy or radiculopathy, lumbar region: Secondary | ICD-10-CM | POA: Diagnosis not present

## 2015-05-04 DIAGNOSIS — R030 Elevated blood-pressure reading, without diagnosis of hypertension: Secondary | ICD-10-CM | POA: Diagnosis not present

## 2015-05-04 DIAGNOSIS — R252 Cramp and spasm: Secondary | ICD-10-CM | POA: Diagnosis not present

## 2015-05-04 DIAGNOSIS — M4806 Spinal stenosis, lumbar region: Secondary | ICD-10-CM | POA: Diagnosis not present

## 2015-05-04 DIAGNOSIS — M4316 Spondylolisthesis, lumbar region: Secondary | ICD-10-CM | POA: Diagnosis not present

## 2015-05-04 DIAGNOSIS — Z0000 Encounter for general adult medical examination without abnormal findings: Secondary | ICD-10-CM | POA: Diagnosis not present

## 2015-05-04 DIAGNOSIS — M545 Low back pain: Secondary | ICD-10-CM | POA: Diagnosis not present

## 2015-05-04 DIAGNOSIS — M5416 Radiculopathy, lumbar region: Secondary | ICD-10-CM | POA: Diagnosis not present

## 2015-05-04 DIAGNOSIS — E782 Mixed hyperlipidemia: Secondary | ICD-10-CM | POA: Diagnosis not present

## 2015-05-04 DIAGNOSIS — K219 Gastro-esophageal reflux disease without esophagitis: Secondary | ICD-10-CM | POA: Diagnosis not present

## 2015-05-04 DIAGNOSIS — Z1389 Encounter for screening for other disorder: Secondary | ICD-10-CM | POA: Diagnosis not present

## 2015-05-10 DIAGNOSIS — N529 Male erectile dysfunction, unspecified: Secondary | ICD-10-CM | POA: Diagnosis not present

## 2015-05-10 DIAGNOSIS — Z91030 Bee allergy status: Secondary | ICD-10-CM | POA: Diagnosis not present

## 2015-05-10 DIAGNOSIS — Z9089 Acquired absence of other organs: Secondary | ICD-10-CM | POA: Diagnosis not present

## 2015-05-10 DIAGNOSIS — H40013 Open angle with borderline findings, low risk, bilateral: Secondary | ICD-10-CM | POA: Diagnosis not present

## 2015-05-10 DIAGNOSIS — I10 Essential (primary) hypertension: Secondary | ICD-10-CM | POA: Diagnosis not present

## 2015-05-10 DIAGNOSIS — H401111 Primary open-angle glaucoma, right eye, mild stage: Secondary | ICD-10-CM | POA: Diagnosis not present

## 2015-05-10 DIAGNOSIS — H5213 Myopia, bilateral: Secondary | ICD-10-CM | POA: Diagnosis not present

## 2015-05-10 DIAGNOSIS — H2512 Age-related nuclear cataract, left eye: Secondary | ICD-10-CM | POA: Diagnosis not present

## 2015-05-10 DIAGNOSIS — K219 Gastro-esophageal reflux disease without esophagitis: Secondary | ICD-10-CM | POA: Diagnosis not present

## 2015-05-10 DIAGNOSIS — N400 Enlarged prostate without lower urinary tract symptoms: Secondary | ICD-10-CM | POA: Diagnosis not present

## 2015-05-10 DIAGNOSIS — E785 Hyperlipidemia, unspecified: Secondary | ICD-10-CM | POA: Diagnosis not present

## 2015-05-10 DIAGNOSIS — Z961 Presence of intraocular lens: Secondary | ICD-10-CM | POA: Diagnosis not present

## 2015-05-10 DIAGNOSIS — Z79899 Other long term (current) drug therapy: Secondary | ICD-10-CM | POA: Diagnosis not present

## 2015-05-10 DIAGNOSIS — M810 Age-related osteoporosis without current pathological fracture: Secondary | ICD-10-CM | POA: Diagnosis not present

## 2015-05-10 DIAGNOSIS — J329 Chronic sinusitis, unspecified: Secondary | ICD-10-CM | POA: Diagnosis not present

## 2015-05-10 DIAGNOSIS — Z7982 Long term (current) use of aspirin: Secondary | ICD-10-CM | POA: Diagnosis not present

## 2015-05-10 DIAGNOSIS — Z7951 Long term (current) use of inhaled steroids: Secondary | ICD-10-CM | POA: Diagnosis not present

## 2015-05-10 DIAGNOSIS — Z9841 Cataract extraction status, right eye: Secondary | ICD-10-CM | POA: Diagnosis not present

## 2015-05-10 DIAGNOSIS — Z85820 Personal history of malignant melanoma of skin: Secondary | ICD-10-CM | POA: Diagnosis not present

## 2015-05-10 DIAGNOSIS — Z85828 Personal history of other malignant neoplasm of skin: Secondary | ICD-10-CM | POA: Diagnosis not present

## 2015-05-11 DIAGNOSIS — H35349 Macular cyst, hole, or pseudohole, unspecified eye: Secondary | ICD-10-CM | POA: Diagnosis not present

## 2015-05-11 DIAGNOSIS — Z961 Presence of intraocular lens: Secondary | ICD-10-CM | POA: Diagnosis not present

## 2015-05-11 DIAGNOSIS — H40013 Open angle with borderline findings, low risk, bilateral: Secondary | ICD-10-CM | POA: Diagnosis not present

## 2015-05-11 DIAGNOSIS — H25811 Combined forms of age-related cataract, right eye: Secondary | ICD-10-CM | POA: Diagnosis not present

## 2015-05-17 DIAGNOSIS — M9902 Segmental and somatic dysfunction of thoracic region: Secondary | ICD-10-CM | POA: Diagnosis not present

## 2015-05-17 DIAGNOSIS — M9904 Segmental and somatic dysfunction of sacral region: Secondary | ICD-10-CM | POA: Diagnosis not present

## 2015-05-17 DIAGNOSIS — M5135 Other intervertebral disc degeneration, thoracolumbar region: Secondary | ICD-10-CM | POA: Diagnosis not present

## 2015-05-17 DIAGNOSIS — M5136 Other intervertebral disc degeneration, lumbar region: Secondary | ICD-10-CM | POA: Diagnosis not present

## 2015-05-17 DIAGNOSIS — M9903 Segmental and somatic dysfunction of lumbar region: Secondary | ICD-10-CM | POA: Diagnosis not present

## 2015-05-17 DIAGNOSIS — M5441 Lumbago with sciatica, right side: Secondary | ICD-10-CM | POA: Diagnosis not present

## 2015-05-22 DIAGNOSIS — M5136 Other intervertebral disc degeneration, lumbar region: Secondary | ICD-10-CM | POA: Diagnosis not present

## 2015-05-22 DIAGNOSIS — M9903 Segmental and somatic dysfunction of lumbar region: Secondary | ICD-10-CM | POA: Diagnosis not present

## 2015-05-22 DIAGNOSIS — M9902 Segmental and somatic dysfunction of thoracic region: Secondary | ICD-10-CM | POA: Diagnosis not present

## 2015-05-22 DIAGNOSIS — M5441 Lumbago with sciatica, right side: Secondary | ICD-10-CM | POA: Diagnosis not present

## 2015-05-22 DIAGNOSIS — M5135 Other intervertebral disc degeneration, thoracolumbar region: Secondary | ICD-10-CM | POA: Diagnosis not present

## 2015-05-22 DIAGNOSIS — M9904 Segmental and somatic dysfunction of sacral region: Secondary | ICD-10-CM | POA: Diagnosis not present

## 2015-05-25 DIAGNOSIS — Z79899 Other long term (current) drug therapy: Secondary | ICD-10-CM | POA: Diagnosis not present

## 2015-05-25 DIAGNOSIS — Z48810 Encounter for surgical aftercare following surgery on the sense organs: Secondary | ICD-10-CM | POA: Diagnosis not present

## 2015-05-25 DIAGNOSIS — E785 Hyperlipidemia, unspecified: Secondary | ICD-10-CM | POA: Diagnosis not present

## 2015-05-25 DIAGNOSIS — Z9842 Cataract extraction status, left eye: Secondary | ICD-10-CM | POA: Diagnosis not present

## 2015-05-25 DIAGNOSIS — M9904 Segmental and somatic dysfunction of sacral region: Secondary | ICD-10-CM | POA: Diagnosis not present

## 2015-05-25 DIAGNOSIS — M5135 Other intervertebral disc degeneration, thoracolumbar region: Secondary | ICD-10-CM | POA: Diagnosis not present

## 2015-05-25 DIAGNOSIS — Z961 Presence of intraocular lens: Secondary | ICD-10-CM | POA: Diagnosis not present

## 2015-05-25 DIAGNOSIS — M9903 Segmental and somatic dysfunction of lumbar region: Secondary | ICD-10-CM | POA: Diagnosis not present

## 2015-05-25 DIAGNOSIS — H401111 Primary open-angle glaucoma, right eye, mild stage: Secondary | ICD-10-CM | POA: Diagnosis not present

## 2015-05-25 DIAGNOSIS — M9902 Segmental and somatic dysfunction of thoracic region: Secondary | ICD-10-CM | POA: Diagnosis not present

## 2015-05-25 DIAGNOSIS — H47393 Other disorders of optic disc, bilateral: Secondary | ICD-10-CM | POA: Diagnosis not present

## 2015-05-25 DIAGNOSIS — I10 Essential (primary) hypertension: Secondary | ICD-10-CM | POA: Diagnosis not present

## 2015-05-25 DIAGNOSIS — M5441 Lumbago with sciatica, right side: Secondary | ICD-10-CM | POA: Diagnosis not present

## 2015-05-25 DIAGNOSIS — Z7982 Long term (current) use of aspirin: Secondary | ICD-10-CM | POA: Diagnosis not present

## 2015-05-25 DIAGNOSIS — Z8669 Personal history of other diseases of the nervous system and sense organs: Secondary | ICD-10-CM | POA: Diagnosis not present

## 2015-05-25 DIAGNOSIS — H40012 Open angle with borderline findings, low risk, left eye: Secondary | ICD-10-CM | POA: Diagnosis not present

## 2015-05-25 DIAGNOSIS — Z9889 Other specified postprocedural states: Secondary | ICD-10-CM | POA: Diagnosis not present

## 2015-05-25 DIAGNOSIS — Z9841 Cataract extraction status, right eye: Secondary | ICD-10-CM | POA: Diagnosis not present

## 2015-05-25 DIAGNOSIS — M5136 Other intervertebral disc degeneration, lumbar region: Secondary | ICD-10-CM | POA: Diagnosis not present

## 2015-05-31 DIAGNOSIS — M9904 Segmental and somatic dysfunction of sacral region: Secondary | ICD-10-CM | POA: Diagnosis not present

## 2015-05-31 DIAGNOSIS — M9903 Segmental and somatic dysfunction of lumbar region: Secondary | ICD-10-CM | POA: Diagnosis not present

## 2015-05-31 DIAGNOSIS — M5135 Other intervertebral disc degeneration, thoracolumbar region: Secondary | ICD-10-CM | POA: Diagnosis not present

## 2015-05-31 DIAGNOSIS — M9902 Segmental and somatic dysfunction of thoracic region: Secondary | ICD-10-CM | POA: Diagnosis not present

## 2015-05-31 DIAGNOSIS — M5441 Lumbago with sciatica, right side: Secondary | ICD-10-CM | POA: Diagnosis not present

## 2015-05-31 DIAGNOSIS — M5136 Other intervertebral disc degeneration, lumbar region: Secondary | ICD-10-CM | POA: Diagnosis not present

## 2015-06-05 DIAGNOSIS — M9903 Segmental and somatic dysfunction of lumbar region: Secondary | ICD-10-CM | POA: Diagnosis not present

## 2015-06-05 DIAGNOSIS — M5441 Lumbago with sciatica, right side: Secondary | ICD-10-CM | POA: Diagnosis not present

## 2015-06-05 DIAGNOSIS — M5136 Other intervertebral disc degeneration, lumbar region: Secondary | ICD-10-CM | POA: Diagnosis not present

## 2015-06-05 DIAGNOSIS — M5135 Other intervertebral disc degeneration, thoracolumbar region: Secondary | ICD-10-CM | POA: Diagnosis not present

## 2015-06-05 DIAGNOSIS — M9904 Segmental and somatic dysfunction of sacral region: Secondary | ICD-10-CM | POA: Diagnosis not present

## 2015-06-05 DIAGNOSIS — M9902 Segmental and somatic dysfunction of thoracic region: Secondary | ICD-10-CM | POA: Diagnosis not present

## 2015-06-08 DIAGNOSIS — M5135 Other intervertebral disc degeneration, thoracolumbar region: Secondary | ICD-10-CM | POA: Diagnosis not present

## 2015-06-08 DIAGNOSIS — M5136 Other intervertebral disc degeneration, lumbar region: Secondary | ICD-10-CM | POA: Diagnosis not present

## 2015-06-08 DIAGNOSIS — M9902 Segmental and somatic dysfunction of thoracic region: Secondary | ICD-10-CM | POA: Diagnosis not present

## 2015-06-08 DIAGNOSIS — M5441 Lumbago with sciatica, right side: Secondary | ICD-10-CM | POA: Diagnosis not present

## 2015-06-08 DIAGNOSIS — M9904 Segmental and somatic dysfunction of sacral region: Secondary | ICD-10-CM | POA: Diagnosis not present

## 2015-06-08 DIAGNOSIS — M9903 Segmental and somatic dysfunction of lumbar region: Secondary | ICD-10-CM | POA: Diagnosis not present

## 2015-06-12 DIAGNOSIS — D692 Other nonthrombocytopenic purpura: Secondary | ICD-10-CM | POA: Diagnosis not present

## 2015-06-12 DIAGNOSIS — B353 Tinea pedis: Secondary | ICD-10-CM | POA: Diagnosis not present

## 2015-06-12 DIAGNOSIS — B352 Tinea manuum: Secondary | ICD-10-CM | POA: Diagnosis not present

## 2015-06-12 DIAGNOSIS — B351 Tinea unguium: Secondary | ICD-10-CM | POA: Diagnosis not present

## 2015-06-13 DIAGNOSIS — M5136 Other intervertebral disc degeneration, lumbar region: Secondary | ICD-10-CM | POA: Diagnosis not present

## 2015-06-13 DIAGNOSIS — M5441 Lumbago with sciatica, right side: Secondary | ICD-10-CM | POA: Diagnosis not present

## 2015-06-13 DIAGNOSIS — M9903 Segmental and somatic dysfunction of lumbar region: Secondary | ICD-10-CM | POA: Diagnosis not present

## 2015-06-13 DIAGNOSIS — M5135 Other intervertebral disc degeneration, thoracolumbar region: Secondary | ICD-10-CM | POA: Diagnosis not present

## 2015-06-13 DIAGNOSIS — M9904 Segmental and somatic dysfunction of sacral region: Secondary | ICD-10-CM | POA: Diagnosis not present

## 2015-06-13 DIAGNOSIS — M9902 Segmental and somatic dysfunction of thoracic region: Secondary | ICD-10-CM | POA: Diagnosis not present

## 2015-06-14 DIAGNOSIS — Z6828 Body mass index (BMI) 28.0-28.9, adult: Secondary | ICD-10-CM | POA: Diagnosis not present

## 2015-06-14 DIAGNOSIS — M62838 Other muscle spasm: Secondary | ICD-10-CM | POA: Diagnosis not present

## 2015-06-14 DIAGNOSIS — M4316 Spondylolisthesis, lumbar region: Secondary | ICD-10-CM | POA: Diagnosis not present

## 2015-06-14 DIAGNOSIS — M5416 Radiculopathy, lumbar region: Secondary | ICD-10-CM | POA: Diagnosis not present

## 2015-06-14 DIAGNOSIS — M545 Low back pain: Secondary | ICD-10-CM | POA: Diagnosis not present

## 2015-06-14 DIAGNOSIS — M4806 Spinal stenosis, lumbar region: Secondary | ICD-10-CM | POA: Diagnosis not present

## 2015-06-19 DIAGNOSIS — M5136 Other intervertebral disc degeneration, lumbar region: Secondary | ICD-10-CM | POA: Diagnosis not present

## 2015-06-19 DIAGNOSIS — M5441 Lumbago with sciatica, right side: Secondary | ICD-10-CM | POA: Diagnosis not present

## 2015-06-19 DIAGNOSIS — M9904 Segmental and somatic dysfunction of sacral region: Secondary | ICD-10-CM | POA: Diagnosis not present

## 2015-06-19 DIAGNOSIS — M5135 Other intervertebral disc degeneration, thoracolumbar region: Secondary | ICD-10-CM | POA: Diagnosis not present

## 2015-06-19 DIAGNOSIS — M9903 Segmental and somatic dysfunction of lumbar region: Secondary | ICD-10-CM | POA: Diagnosis not present

## 2015-06-19 DIAGNOSIS — M9902 Segmental and somatic dysfunction of thoracic region: Secondary | ICD-10-CM | POA: Diagnosis not present

## 2015-06-20 DIAGNOSIS — E785 Hyperlipidemia, unspecified: Secondary | ICD-10-CM | POA: Diagnosis not present

## 2015-06-20 DIAGNOSIS — H40012 Open angle with borderline findings, low risk, left eye: Secondary | ICD-10-CM | POA: Diagnosis not present

## 2015-06-20 DIAGNOSIS — H401112 Primary open-angle glaucoma, right eye, moderate stage: Secondary | ICD-10-CM | POA: Diagnosis not present

## 2015-06-20 DIAGNOSIS — Z961 Presence of intraocular lens: Secondary | ICD-10-CM | POA: Diagnosis not present

## 2015-06-20 DIAGNOSIS — Z79899 Other long term (current) drug therapy: Secondary | ICD-10-CM | POA: Diagnosis not present

## 2015-06-20 DIAGNOSIS — H35341 Macular cyst, hole, or pseudohole, right eye: Secondary | ICD-10-CM | POA: Diagnosis not present

## 2015-06-20 DIAGNOSIS — Z9889 Other specified postprocedural states: Secondary | ICD-10-CM | POA: Diagnosis not present

## 2015-06-20 DIAGNOSIS — Z9842 Cataract extraction status, left eye: Secondary | ICD-10-CM | POA: Diagnosis not present

## 2015-06-20 DIAGNOSIS — Z9841 Cataract extraction status, right eye: Secondary | ICD-10-CM | POA: Diagnosis not present

## 2015-06-20 DIAGNOSIS — I10 Essential (primary) hypertension: Secondary | ICD-10-CM | POA: Diagnosis not present

## 2015-06-22 DIAGNOSIS — M5135 Other intervertebral disc degeneration, thoracolumbar region: Secondary | ICD-10-CM | POA: Diagnosis not present

## 2015-06-22 DIAGNOSIS — M9902 Segmental and somatic dysfunction of thoracic region: Secondary | ICD-10-CM | POA: Diagnosis not present

## 2015-06-22 DIAGNOSIS — M9904 Segmental and somatic dysfunction of sacral region: Secondary | ICD-10-CM | POA: Diagnosis not present

## 2015-06-22 DIAGNOSIS — M5136 Other intervertebral disc degeneration, lumbar region: Secondary | ICD-10-CM | POA: Diagnosis not present

## 2015-06-22 DIAGNOSIS — M5441 Lumbago with sciatica, right side: Secondary | ICD-10-CM | POA: Diagnosis not present

## 2015-06-22 DIAGNOSIS — M9903 Segmental and somatic dysfunction of lumbar region: Secondary | ICD-10-CM | POA: Diagnosis not present

## 2015-06-26 DIAGNOSIS — M5136 Other intervertebral disc degeneration, lumbar region: Secondary | ICD-10-CM | POA: Diagnosis not present

## 2015-06-26 DIAGNOSIS — M9903 Segmental and somatic dysfunction of lumbar region: Secondary | ICD-10-CM | POA: Diagnosis not present

## 2015-06-26 DIAGNOSIS — M9902 Segmental and somatic dysfunction of thoracic region: Secondary | ICD-10-CM | POA: Diagnosis not present

## 2015-06-26 DIAGNOSIS — M5441 Lumbago with sciatica, right side: Secondary | ICD-10-CM | POA: Diagnosis not present

## 2015-06-26 DIAGNOSIS — H9313 Tinnitus, bilateral: Secondary | ICD-10-CM | POA: Diagnosis not present

## 2015-06-26 DIAGNOSIS — H903 Sensorineural hearing loss, bilateral: Secondary | ICD-10-CM | POA: Diagnosis not present

## 2015-06-26 DIAGNOSIS — M9904 Segmental and somatic dysfunction of sacral region: Secondary | ICD-10-CM | POA: Diagnosis not present

## 2015-06-26 DIAGNOSIS — M5135 Other intervertebral disc degeneration, thoracolumbar region: Secondary | ICD-10-CM | POA: Diagnosis not present

## 2015-06-29 DIAGNOSIS — M5135 Other intervertebral disc degeneration, thoracolumbar region: Secondary | ICD-10-CM | POA: Diagnosis not present

## 2015-06-29 DIAGNOSIS — M9902 Segmental and somatic dysfunction of thoracic region: Secondary | ICD-10-CM | POA: Diagnosis not present

## 2015-06-29 DIAGNOSIS — M5441 Lumbago with sciatica, right side: Secondary | ICD-10-CM | POA: Diagnosis not present

## 2015-06-29 DIAGNOSIS — M9904 Segmental and somatic dysfunction of sacral region: Secondary | ICD-10-CM | POA: Diagnosis not present

## 2015-06-29 DIAGNOSIS — M5136 Other intervertebral disc degeneration, lumbar region: Secondary | ICD-10-CM | POA: Diagnosis not present

## 2015-06-29 DIAGNOSIS — M9903 Segmental and somatic dysfunction of lumbar region: Secondary | ICD-10-CM | POA: Diagnosis not present

## 2015-07-05 DIAGNOSIS — M9904 Segmental and somatic dysfunction of sacral region: Secondary | ICD-10-CM | POA: Diagnosis not present

## 2015-07-05 DIAGNOSIS — M9903 Segmental and somatic dysfunction of lumbar region: Secondary | ICD-10-CM | POA: Diagnosis not present

## 2015-07-05 DIAGNOSIS — M5441 Lumbago with sciatica, right side: Secondary | ICD-10-CM | POA: Diagnosis not present

## 2015-07-05 DIAGNOSIS — M5135 Other intervertebral disc degeneration, thoracolumbar region: Secondary | ICD-10-CM | POA: Diagnosis not present

## 2015-07-05 DIAGNOSIS — M9902 Segmental and somatic dysfunction of thoracic region: Secondary | ICD-10-CM | POA: Diagnosis not present

## 2015-07-05 DIAGNOSIS — M5136 Other intervertebral disc degeneration, lumbar region: Secondary | ICD-10-CM | POA: Diagnosis not present

## 2015-07-10 DIAGNOSIS — M5441 Lumbago with sciatica, right side: Secondary | ICD-10-CM | POA: Diagnosis not present

## 2015-07-10 DIAGNOSIS — M9902 Segmental and somatic dysfunction of thoracic region: Secondary | ICD-10-CM | POA: Diagnosis not present

## 2015-07-10 DIAGNOSIS — M5136 Other intervertebral disc degeneration, lumbar region: Secondary | ICD-10-CM | POA: Diagnosis not present

## 2015-07-10 DIAGNOSIS — M9904 Segmental and somatic dysfunction of sacral region: Secondary | ICD-10-CM | POA: Diagnosis not present

## 2015-07-10 DIAGNOSIS — M5135 Other intervertebral disc degeneration, thoracolumbar region: Secondary | ICD-10-CM | POA: Diagnosis not present

## 2015-07-10 DIAGNOSIS — M9903 Segmental and somatic dysfunction of lumbar region: Secondary | ICD-10-CM | POA: Diagnosis not present

## 2015-07-13 DIAGNOSIS — M5135 Other intervertebral disc degeneration, thoracolumbar region: Secondary | ICD-10-CM | POA: Diagnosis not present

## 2015-07-13 DIAGNOSIS — M9903 Segmental and somatic dysfunction of lumbar region: Secondary | ICD-10-CM | POA: Diagnosis not present

## 2015-07-13 DIAGNOSIS — M9904 Segmental and somatic dysfunction of sacral region: Secondary | ICD-10-CM | POA: Diagnosis not present

## 2015-07-13 DIAGNOSIS — M9902 Segmental and somatic dysfunction of thoracic region: Secondary | ICD-10-CM | POA: Diagnosis not present

## 2015-07-13 DIAGNOSIS — M5136 Other intervertebral disc degeneration, lumbar region: Secondary | ICD-10-CM | POA: Diagnosis not present

## 2015-07-13 DIAGNOSIS — M5441 Lumbago with sciatica, right side: Secondary | ICD-10-CM | POA: Diagnosis not present

## 2015-07-17 DIAGNOSIS — M5136 Other intervertebral disc degeneration, lumbar region: Secondary | ICD-10-CM | POA: Diagnosis not present

## 2015-07-17 DIAGNOSIS — M9903 Segmental and somatic dysfunction of lumbar region: Secondary | ICD-10-CM | POA: Diagnosis not present

## 2015-07-17 DIAGNOSIS — M5135 Other intervertebral disc degeneration, thoracolumbar region: Secondary | ICD-10-CM | POA: Diagnosis not present

## 2015-07-17 DIAGNOSIS — M9904 Segmental and somatic dysfunction of sacral region: Secondary | ICD-10-CM | POA: Diagnosis not present

## 2015-07-17 DIAGNOSIS — M9902 Segmental and somatic dysfunction of thoracic region: Secondary | ICD-10-CM | POA: Diagnosis not present

## 2015-07-17 DIAGNOSIS — M5441 Lumbago with sciatica, right side: Secondary | ICD-10-CM | POA: Diagnosis not present

## 2015-07-20 DIAGNOSIS — M5136 Other intervertebral disc degeneration, lumbar region: Secondary | ICD-10-CM | POA: Diagnosis not present

## 2015-07-20 DIAGNOSIS — M9904 Segmental and somatic dysfunction of sacral region: Secondary | ICD-10-CM | POA: Diagnosis not present

## 2015-07-20 DIAGNOSIS — M9902 Segmental and somatic dysfunction of thoracic region: Secondary | ICD-10-CM | POA: Diagnosis not present

## 2015-07-20 DIAGNOSIS — M5135 Other intervertebral disc degeneration, thoracolumbar region: Secondary | ICD-10-CM | POA: Diagnosis not present

## 2015-07-20 DIAGNOSIS — M5441 Lumbago with sciatica, right side: Secondary | ICD-10-CM | POA: Diagnosis not present

## 2015-07-20 DIAGNOSIS — M9903 Segmental and somatic dysfunction of lumbar region: Secondary | ICD-10-CM | POA: Diagnosis not present

## 2015-08-03 DIAGNOSIS — M9903 Segmental and somatic dysfunction of lumbar region: Secondary | ICD-10-CM | POA: Diagnosis not present

## 2015-08-03 DIAGNOSIS — M5417 Radiculopathy, lumbosacral region: Secondary | ICD-10-CM | POA: Diagnosis not present

## 2015-08-03 DIAGNOSIS — Q72812 Congenital shortening of left lower limb: Secondary | ICD-10-CM | POA: Diagnosis not present

## 2015-08-03 DIAGNOSIS — M9905 Segmental and somatic dysfunction of pelvic region: Secondary | ICD-10-CM | POA: Diagnosis not present

## 2015-08-03 DIAGNOSIS — M5137 Other intervertebral disc degeneration, lumbosacral region: Secondary | ICD-10-CM | POA: Diagnosis not present

## 2015-08-03 DIAGNOSIS — M9904 Segmental and somatic dysfunction of sacral region: Secondary | ICD-10-CM | POA: Diagnosis not present

## 2015-08-29 DIAGNOSIS — Z9841 Cataract extraction status, right eye: Secondary | ICD-10-CM | POA: Diagnosis not present

## 2015-08-29 DIAGNOSIS — H26493 Other secondary cataract, bilateral: Secondary | ICD-10-CM | POA: Diagnosis not present

## 2015-08-29 DIAGNOSIS — Z9842 Cataract extraction status, left eye: Secondary | ICD-10-CM | POA: Diagnosis not present

## 2015-08-29 DIAGNOSIS — Z9889 Other specified postprocedural states: Secondary | ICD-10-CM | POA: Diagnosis not present

## 2015-08-29 DIAGNOSIS — H04123 Dry eye syndrome of bilateral lacrimal glands: Secondary | ICD-10-CM | POA: Diagnosis not present

## 2015-08-29 DIAGNOSIS — H401111 Primary open-angle glaucoma, right eye, mild stage: Secondary | ICD-10-CM | POA: Diagnosis not present

## 2015-08-29 DIAGNOSIS — H0289 Other specified disorders of eyelid: Secondary | ICD-10-CM | POA: Diagnosis not present

## 2015-08-29 DIAGNOSIS — H40012 Open angle with borderline findings, low risk, left eye: Secondary | ICD-10-CM | POA: Diagnosis not present

## 2015-08-29 DIAGNOSIS — E785 Hyperlipidemia, unspecified: Secondary | ICD-10-CM | POA: Diagnosis not present

## 2015-08-29 DIAGNOSIS — Z7982 Long term (current) use of aspirin: Secondary | ICD-10-CM | POA: Diagnosis not present

## 2015-08-29 DIAGNOSIS — Z79899 Other long term (current) drug therapy: Secondary | ICD-10-CM | POA: Diagnosis not present

## 2015-08-29 DIAGNOSIS — I10 Essential (primary) hypertension: Secondary | ICD-10-CM | POA: Diagnosis not present

## 2015-08-29 DIAGNOSIS — Z961 Presence of intraocular lens: Secondary | ICD-10-CM | POA: Diagnosis not present

## 2015-08-30 DIAGNOSIS — R011 Cardiac murmur, unspecified: Secondary | ICD-10-CM | POA: Diagnosis not present

## 2015-08-30 DIAGNOSIS — R600 Localized edema: Secondary | ICD-10-CM | POA: Diagnosis not present

## 2015-09-06 DIAGNOSIS — R0602 Shortness of breath: Secondary | ICD-10-CM | POA: Diagnosis not present

## 2015-09-06 DIAGNOSIS — R609 Edema, unspecified: Secondary | ICD-10-CM | POA: Diagnosis not present

## 2015-09-08 DIAGNOSIS — R0602 Shortness of breath: Secondary | ICD-10-CM | POA: Diagnosis not present

## 2015-09-08 DIAGNOSIS — R609 Edema, unspecified: Secondary | ICD-10-CM | POA: Diagnosis not present

## 2015-09-22 DIAGNOSIS — I10 Essential (primary) hypertension: Secondary | ICD-10-CM | POA: Diagnosis not present

## 2015-09-22 DIAGNOSIS — H5213 Myopia, bilateral: Secondary | ICD-10-CM | POA: Diagnosis not present

## 2015-09-22 DIAGNOSIS — Z9841 Cataract extraction status, right eye: Secondary | ICD-10-CM | POA: Diagnosis not present

## 2015-09-22 DIAGNOSIS — H35342 Macular cyst, hole, or pseudohole, left eye: Secondary | ICD-10-CM | POA: Diagnosis not present

## 2015-09-22 DIAGNOSIS — Z9889 Other specified postprocedural states: Secondary | ICD-10-CM | POA: Diagnosis not present

## 2015-09-22 DIAGNOSIS — Z961 Presence of intraocular lens: Secondary | ICD-10-CM | POA: Diagnosis not present

## 2015-09-22 DIAGNOSIS — E785 Hyperlipidemia, unspecified: Secondary | ICD-10-CM | POA: Diagnosis not present

## 2015-09-22 DIAGNOSIS — Z9842 Cataract extraction status, left eye: Secondary | ICD-10-CM | POA: Diagnosis not present

## 2015-09-22 DIAGNOSIS — Z7982 Long term (current) use of aspirin: Secondary | ICD-10-CM | POA: Diagnosis not present

## 2015-09-22 DIAGNOSIS — Z79899 Other long term (current) drug therapy: Secondary | ICD-10-CM | POA: Diagnosis not present

## 2015-10-04 DIAGNOSIS — R609 Edema, unspecified: Secondary | ICD-10-CM | POA: Diagnosis not present

## 2015-10-04 DIAGNOSIS — R0602 Shortness of breath: Secondary | ICD-10-CM | POA: Diagnosis not present

## 2015-10-19 DIAGNOSIS — J310 Chronic rhinitis: Secondary | ICD-10-CM | POA: Diagnosis not present

## 2015-10-19 DIAGNOSIS — Z7982 Long term (current) use of aspirin: Secondary | ICD-10-CM | POA: Diagnosis not present

## 2015-10-19 DIAGNOSIS — Z79899 Other long term (current) drug therapy: Secondary | ICD-10-CM | POA: Diagnosis not present

## 2015-10-19 DIAGNOSIS — I10 Essential (primary) hypertension: Secondary | ICD-10-CM | POA: Diagnosis not present

## 2015-10-19 DIAGNOSIS — E785 Hyperlipidemia, unspecified: Secondary | ICD-10-CM | POA: Diagnosis not present

## 2015-10-19 DIAGNOSIS — J329 Chronic sinusitis, unspecified: Secondary | ICD-10-CM | POA: Diagnosis not present

## 2015-10-19 DIAGNOSIS — J328 Other chronic sinusitis: Secondary | ICD-10-CM | POA: Diagnosis not present

## 2015-10-19 DIAGNOSIS — Z9889 Other specified postprocedural states: Secondary | ICD-10-CM | POA: Diagnosis not present

## 2015-10-19 DIAGNOSIS — J3489 Other specified disorders of nose and nasal sinuses: Secondary | ICD-10-CM | POA: Diagnosis not present

## 2015-10-19 DIAGNOSIS — J300 Vasomotor rhinitis: Secondary | ICD-10-CM | POA: Diagnosis not present

## 2015-11-02 DIAGNOSIS — H25811 Combined forms of age-related cataract, right eye: Secondary | ICD-10-CM | POA: Diagnosis not present

## 2015-11-02 DIAGNOSIS — Z7982 Long term (current) use of aspirin: Secondary | ICD-10-CM | POA: Diagnosis not present

## 2015-11-02 DIAGNOSIS — H43822 Vitreomacular adhesion, left eye: Secondary | ICD-10-CM | POA: Diagnosis not present

## 2015-11-02 DIAGNOSIS — I10 Essential (primary) hypertension: Secondary | ICD-10-CM | POA: Diagnosis not present

## 2015-11-02 DIAGNOSIS — E785 Hyperlipidemia, unspecified: Secondary | ICD-10-CM | POA: Diagnosis not present

## 2015-11-02 DIAGNOSIS — Z888 Allergy status to other drugs, medicaments and biological substances status: Secondary | ICD-10-CM | POA: Diagnosis not present

## 2015-11-02 DIAGNOSIS — H40012 Open angle with borderline findings, low risk, left eye: Secondary | ICD-10-CM | POA: Diagnosis not present

## 2015-11-02 DIAGNOSIS — H2511 Age-related nuclear cataract, right eye: Secondary | ICD-10-CM | POA: Diagnosis not present

## 2015-11-02 DIAGNOSIS — Z9889 Other specified postprocedural states: Secondary | ICD-10-CM | POA: Diagnosis not present

## 2015-11-02 DIAGNOSIS — H35341 Macular cyst, hole, or pseudohole, right eye: Secondary | ICD-10-CM | POA: Diagnosis not present

## 2015-11-02 DIAGNOSIS — H401111 Primary open-angle glaucoma, right eye, mild stage: Secondary | ICD-10-CM | POA: Diagnosis not present

## 2015-11-02 DIAGNOSIS — H0289 Other specified disorders of eyelid: Secondary | ICD-10-CM | POA: Diagnosis not present

## 2015-11-02 DIAGNOSIS — Z79899 Other long term (current) drug therapy: Secondary | ICD-10-CM | POA: Diagnosis not present

## 2015-11-02 DIAGNOSIS — H04123 Dry eye syndrome of bilateral lacrimal glands: Secondary | ICD-10-CM | POA: Diagnosis not present

## 2015-11-02 DIAGNOSIS — H47393 Other disorders of optic disc, bilateral: Secondary | ICD-10-CM | POA: Diagnosis not present

## 2015-11-02 DIAGNOSIS — H26493 Other secondary cataract, bilateral: Secondary | ICD-10-CM | POA: Diagnosis not present

## 2015-11-02 DIAGNOSIS — Z961 Presence of intraocular lens: Secondary | ICD-10-CM | POA: Diagnosis not present

## 2015-11-02 DIAGNOSIS — Z9289 Personal history of other medical treatment: Secondary | ICD-10-CM | POA: Diagnosis not present

## 2015-11-07 DIAGNOSIS — Z86008 Personal history of in-situ neoplasm of other site: Secondary | ICD-10-CM | POA: Diagnosis not present

## 2015-11-07 DIAGNOSIS — L821 Other seborrheic keratosis: Secondary | ICD-10-CM | POA: Diagnosis not present

## 2015-11-07 DIAGNOSIS — R252 Cramp and spasm: Secondary | ICD-10-CM | POA: Diagnosis not present

## 2015-11-07 DIAGNOSIS — Z23 Encounter for immunization: Secondary | ICD-10-CM | POA: Diagnosis not present

## 2015-11-07 DIAGNOSIS — M48061 Spinal stenosis, lumbar region without neurogenic claudication: Secondary | ICD-10-CM | POA: Diagnosis not present

## 2015-11-07 DIAGNOSIS — B351 Tinea unguium: Secondary | ICD-10-CM | POA: Diagnosis not present

## 2015-11-07 DIAGNOSIS — K219 Gastro-esophageal reflux disease without esophagitis: Secondary | ICD-10-CM | POA: Diagnosis not present

## 2015-11-07 DIAGNOSIS — L578 Other skin changes due to chronic exposure to nonionizing radiation: Secondary | ICD-10-CM | POA: Diagnosis not present

## 2015-11-07 DIAGNOSIS — L570 Actinic keratosis: Secondary | ICD-10-CM | POA: Diagnosis not present

## 2015-11-07 DIAGNOSIS — L814 Other melanin hyperpigmentation: Secondary | ICD-10-CM | POA: Diagnosis not present

## 2015-11-07 DIAGNOSIS — I10 Essential (primary) hypertension: Secondary | ICD-10-CM | POA: Diagnosis not present

## 2015-11-07 DIAGNOSIS — Z85828 Personal history of other malignant neoplasm of skin: Secondary | ICD-10-CM | POA: Diagnosis not present

## 2015-11-07 DIAGNOSIS — I781 Nevus, non-neoplastic: Secondary | ICD-10-CM | POA: Diagnosis not present

## 2016-08-26 IMAGING — MR MR LUMBAR SPINE W/O CM
5 series · 43 of 48 positions shown · non-contrast
Comparison: Lumbar spine radiographs 04/08/2013

CLINICAL DATA: Low back pain radiating to the buttocks and upper
thighs.

EXAM:
MRI LUMBAR SPINE WITHOUT CONTRAST
TECHNIQUE: Multiplanar, multisequence MR imaging of the lumbar spine was
performed. No intravenous contrast was administered.

[Series 3: tirm sag · sagittal · 4.0mm · 0.55mm/px · 6 of 13 slices shown]
[im 1/13]
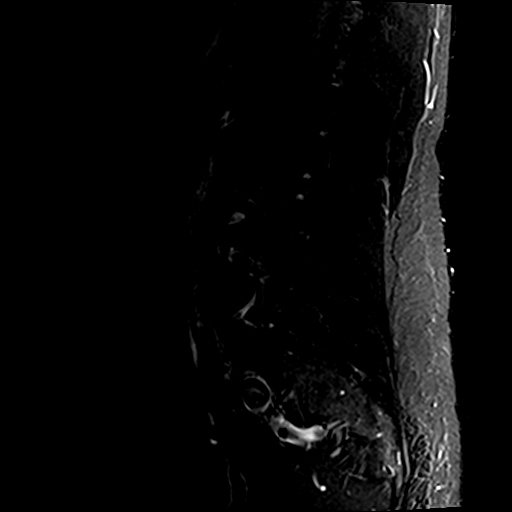
[im 3/13]
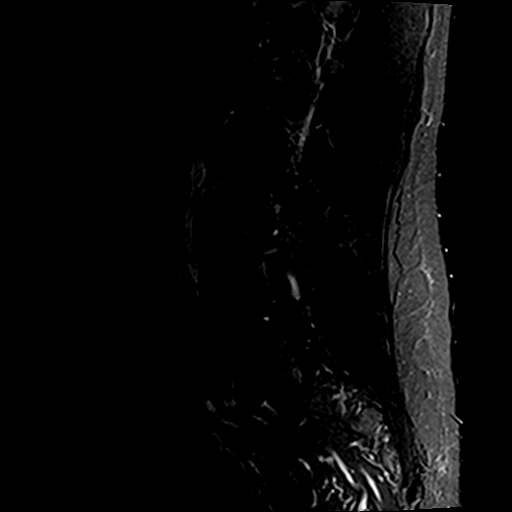
[im 5/13]
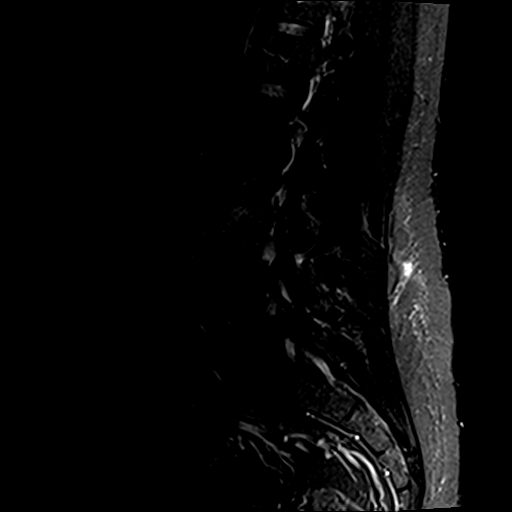
[im 8/13]
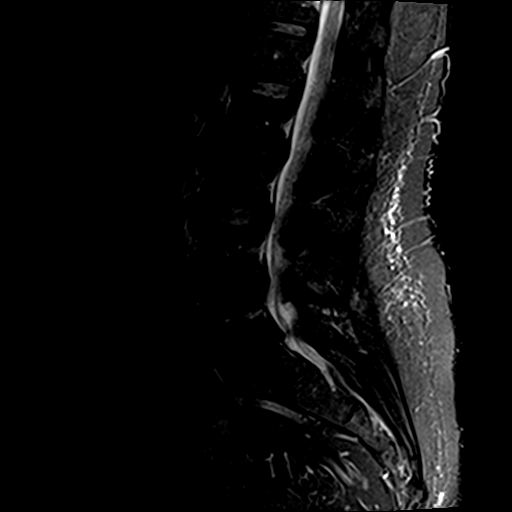
[im 10/13]
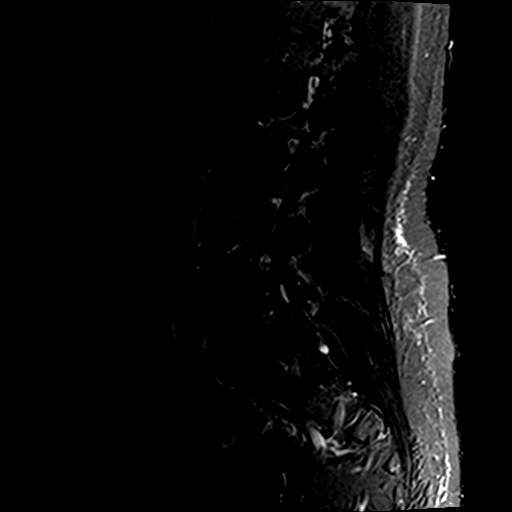
[im 13/13]
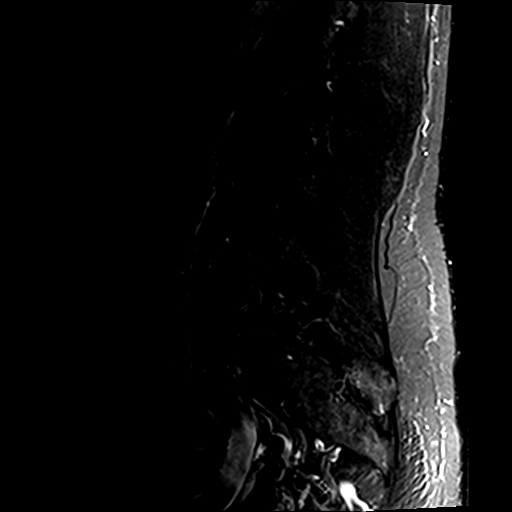

[Series 4: T2 · sagittal · 4.0mm · 0.88mm/px · 6 of 13 slices shown (1 of 2)]
[im 1/13]
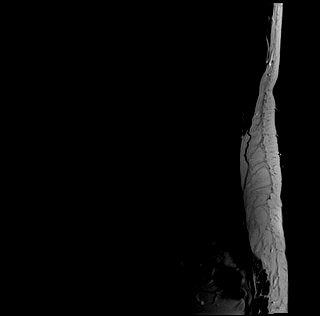
[im 3/13]
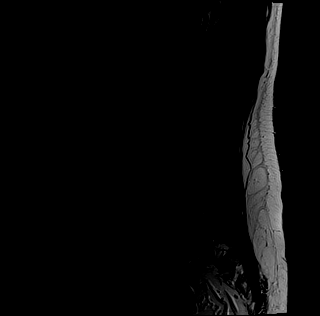
[im 5/13]
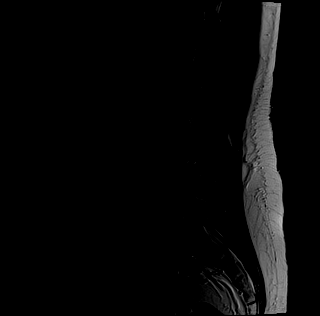
[im 8/13]
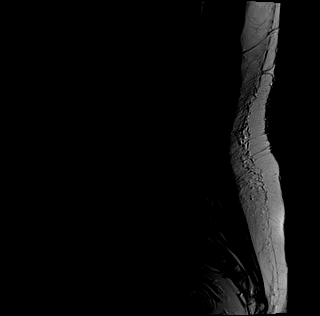
[im 10/13]
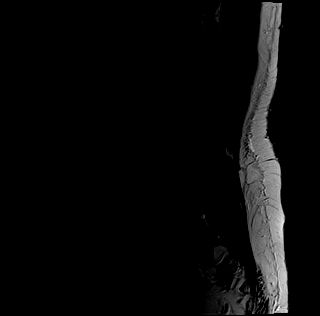
[im 13/13]
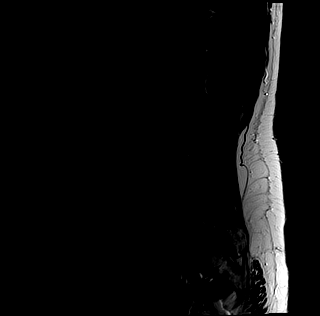

[Series 5: T1 · sagittal · 4.0mm · 0.88mm/px · 6 of 13 slices shown (1 of 2)]
[im 1/13]
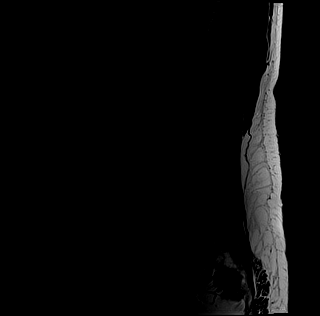
[im 3/13]
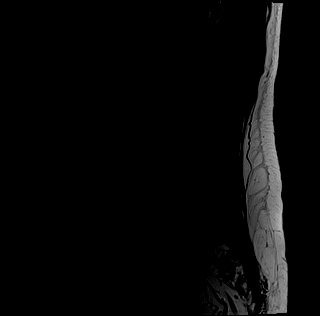
[im 5/13]
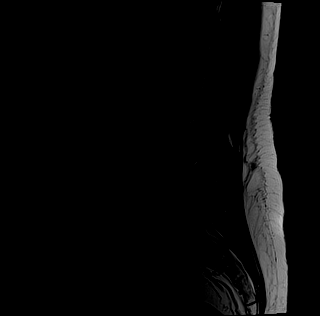
[im 8/13]
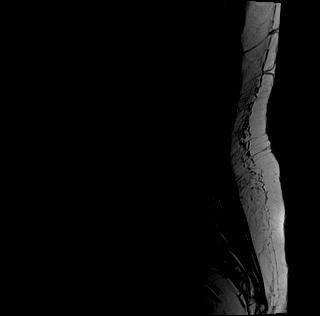
[im 10/13]
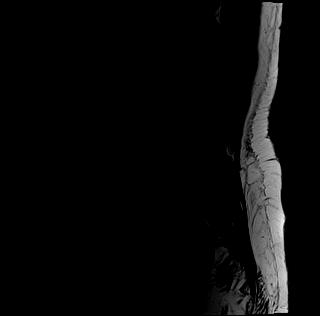
[im 13/13]
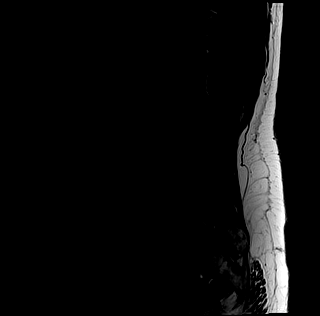

[Series 6: T1 · axial · 4.0mm · 0.78mm/px · z∈[-73,+116]mm · 10 of 33 slices shown (2 of 2)]
[im 1/33]
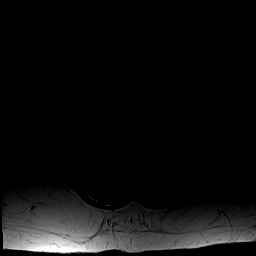
[im 3/33]
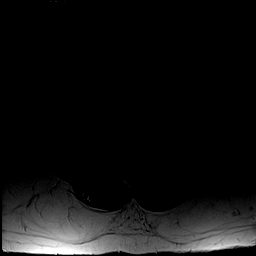
[im 5/33]
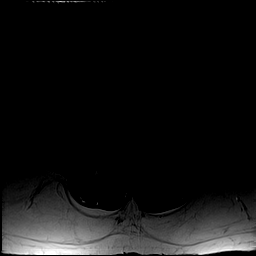
[im 10/33]
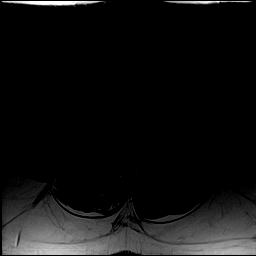
[im 14/33]
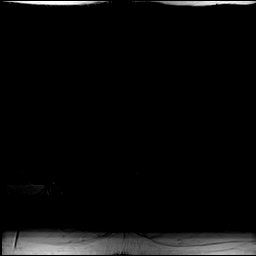
[im 17/33]
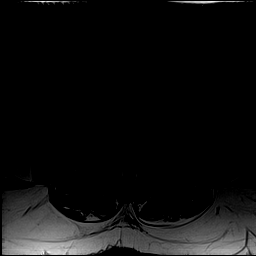
[im 19/33]
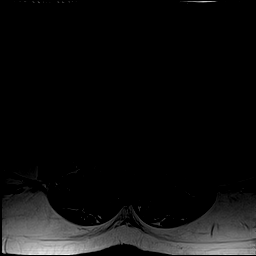
[im 23/33]
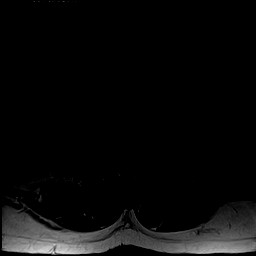
[im 28/33]
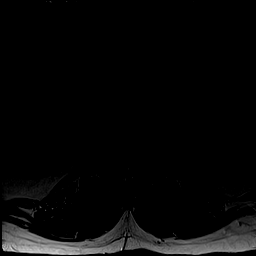
[im 33/33]
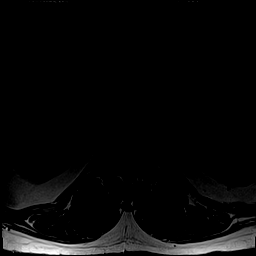

[Series 7: T2 · axial · 4.0mm · 0.78mm/px · z∈[-73,+116]mm · 15 of 33 slices shown (2 of 2)]
[im 1/33]
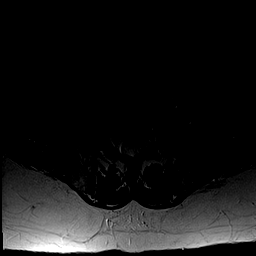
[im 3/33]
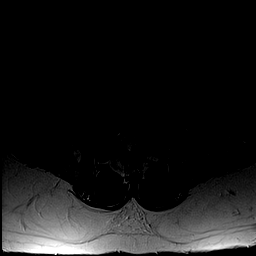
[im 5/33]
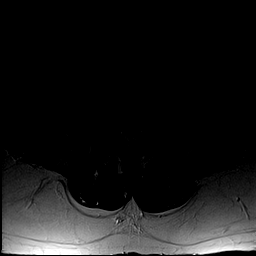
[im 7/33]
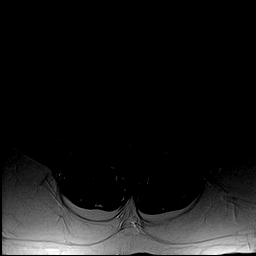
[im 10/33]
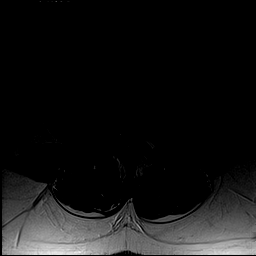
[im 12/33]
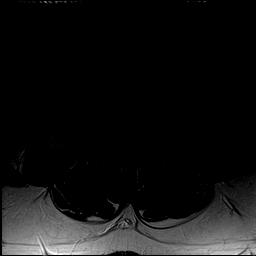
[im 14/33]
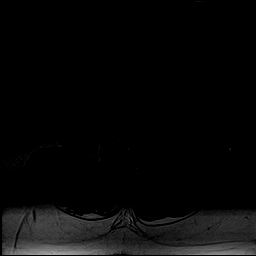
[im 17/33]
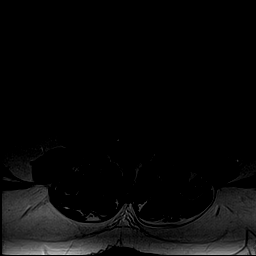
[im 19/33]
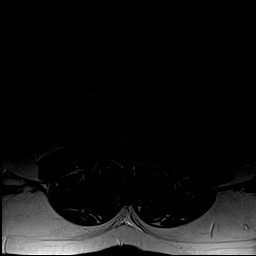
[im 21/33]
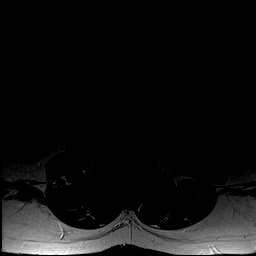
[im 23/33]
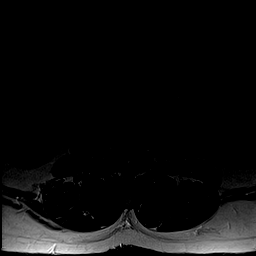
[im 26/33]
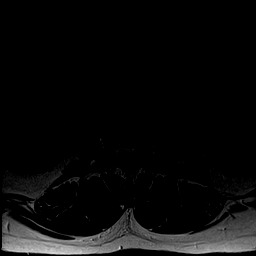
[im 28/33]
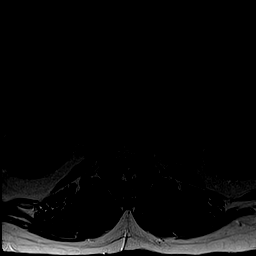
[im 30/33]
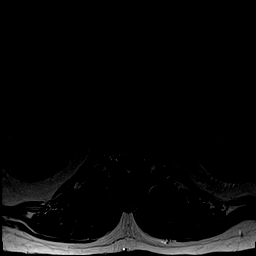
[im 33/33]
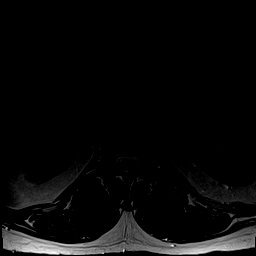

[43 of 48 positions shown; findings below may reference images not displayed]

FINDINGS: There is trace retrolisthesis of L2 on L3. Vertebral body are
preserved. Disc desiccation is present at L2-3, L4-5, and L5-S1.
There is very mild disc space narrowing at L2-3 and L4-5. No
vertebral marrow edema is seen. Conus medullaris is normal in signal
and terminates at the superior aspect of L2. Paraspinal soft tissues
are unremarkable.

L1-2:  Negative.

L2-3: Mild disc bulging and mild facet hypertrophy result in mild
bilateral lateral recess narrowing and minimal bilateral neural
foraminal narrowing without significant spinal stenosis.

L3-4: Minimal disc bulging, mild facet hypertrophy, and congenitally
short pedicles result in mild bilateral lateral recess stenosis,
mild spinal stenosis, and mild bilateral neural foraminal stenosis.

L4-5: Mild circumferential disc bulging, moderate facet and
ligamentum flavum hypertrophy, and congenitally short pedicles
result in moderate left greater than right lateral recess stenosis,
mild spinal stenosis, and moderate bilateral neural foraminal
stenosis. There are small right and moderate left facet joint
effusions.

L5-S1: Mild disc bulging and moderate facet hypertrophy result in
mild bilateral lateral recess stenosis and moderate to severe
bilateral neural foraminal stenosis without spinal stenosis.
IMPRESSION: 1. Mild spinal stenosis at L3-4 and L4-5. Posterior element
hypertrophy at L4-5 contributes to moderate bilateral lateral recess
and neural foraminal stenosis.
2. Moderate to severe bilateral neural foraminal stenosis at L5-S1.
